# Patient Record
Sex: Male | Born: 1955
Health system: Southern US, Community
[De-identification: ages and names within clinical notes are randomized; demographics above are authoritative.]

## PROBLEM LIST (undated history)

## (undated) DIAGNOSIS — I4891 Unspecified atrial fibrillation: Secondary | ICD-10-CM

---

## 2020-01-02 MED FILL — CARTIA XT 240 MG CAPSULE: 240 | 30 days supply | Qty: 30 | Fill #0

## 2020-01-02 MED FILL — METOPROLOL SUCCINATE ER 25: 25 | 30 days supply | Qty: 30 | Fill #0

## 2020-01-02 MED FILL — PROPRANOLOL 10 MG TABLET: 10 | 7 days supply | Qty: 30 | Fill #0

## 2020-01-02 MED FILL — ELIQUIS 5 MG TABLET: 5 | 30 days supply | Qty: 60 | Fill #0

## 2020-07-15 DIAGNOSIS — J9601 Acute respiratory failure with hypoxia: Principal | ICD-10-CM

## 2020-07-15 DIAGNOSIS — R55 Syncope and collapse: Secondary | ICD-10-CM | POA: Diagnosis present

## 2020-07-15 DIAGNOSIS — S0101XA Laceration without foreign body of scalp, initial encounter: Secondary | ICD-10-CM

## 2020-07-15 DIAGNOSIS — U071 COVID-19: Secondary | ICD-10-CM

## 2020-07-15 HISTORY — DX: Unspecified atrial fibrillation: I48.91

## 2020-07-15 NOTE — H&P (Incomplete)
Family [facility] Admission History and Physical Service Pager: [phone]  Patient name: Patrick Horne Medical record number: [id number] Date of birth: 20-Feb-1956 Age: 65 y.o. Gender: male  Primary Care Provider: Pcp, No Consultants: *** Code Status: ***  Chief Complaint: ***  Assessment and Plan: Patrick Horne is a 65 y.o. male presenting with *** . PMH is significant for ***  Syncope  Afib 2 years ago.  eliquis Diltiazem Propanolol  COVID sxs started Thursday 5 pills/day for 3 days    FEN/GI: *** Prophylaxis: ***  Disposition: ***  History of Present Illness:  Patrick Horne is a 66 y.o. male presenting with ***  Shaving, feeling spacing. Woke up on the ground with his wife over him. No fecal or urinary incontinence.   Felt poorly last night and needed to sit down and put a cool towel over his head.  Wife heard a thud and went immediately  Second episode in the ED.  PA was putting staples in the scalp incision. When he went to lie down, he passed out again briefly.    Also felt the world spinning with sitting up.  Review Of Systems: Per HPI with the following additions: ***  ROS  Patient Active Problem List   Diagnosis Date Noted  . Syncope 07/15/2020    Past Medical History: Past Medical History:  Diagnosis Date  . Atrial fibrillation [facility])     Past Surgical History: History reviewed. No pertinent surgical history.  Social History: Social History   Tobacco Use  . Smoking status: Never Smoker  . Smokeless tobacco: Never Used   Additional social history: ***  Please also refer to relevant sections of EMR.  Family History: History reviewed. No pertinent family history. (***If not completed, MUST add something in)  Allergies and Medications: No Known Allergies No current facility-administered medications on file prior to encounter.   Current Outpatient Medications on File Prior to Encounter  Medication Sig  Dispense Refill  . diltiazem (CARDIZEM CD) 240 MG 24 hr capsule Take 240 mg by mouth daily.    Marland Kitchen ELIQUIS 5 MG TABS tablet Take 5 mg by mouth 2 (two) times daily.    . magnesium oxide (MAG-OX) 400 MG tablet Take 400 mg by mouth daily.    . metoprolol succinate (TOPROL-XL) 25 MG 24 hr tablet Take 25 mg by mouth daily.    . propranolol (INDERAL) 10 MG tablet Take 10 mg by mouth daily.    Marland Kitchen VITAMIN D, CHOLECALCIFEROL, PO Take 1 capsule by mouth daily.    Marland Kitchen zinc gluconate 50 MG tablet Take 50 mg by mouth daily.      Objective: BP (!) 101/57   Pulse 67   Temp 97.9 F (36.6 C) (Oral)   Resp 20   Ht 6\' 2"  (1.88 m)   Wt 104.3 kg   SpO2 92%   BMI 29.53 kg/m  Exam: General: *** Eyes: *** ENTM: *** Neck: *** Cardiovascular: *** Respiratory: *** Gastrointestinal: *** MSK: *** Derm: *** Neuro: *** Psych: ***  Labs and Imaging: CBC BMET  Recent Labs  Lab 07/15/20 1043  WBC 5.1  HGB 16.3  HCT 47.8  PLT 151   Recent Labs  Lab 07/15/20 1043  NA 135  K 4.0  CL 104  CO2 19*  BUN 22  CREATININE 1.26*  GLUCOSE 115*  CALCIUM 8.6*     ***  09/12/20, MD 07/15/2020, 3:11 PM PGY-***, [facility] Family Medicine FPTS Intern pager: [phone], text  pages welcome

## 2020-07-16 DIAGNOSIS — S0101XA Laceration without foreign body of scalp, initial encounter: Secondary | ICD-10-CM

## 2020-07-16 DIAGNOSIS — R55 Syncope and collapse: Secondary | ICD-10-CM

## 2020-07-17 DIAGNOSIS — R55 Syncope and collapse: Secondary | ICD-10-CM

## 2020-07-18 DIAGNOSIS — U071 COVID-19: Secondary | ICD-10-CM

## 2022-07-01 IMAGING — CT CT CERVICAL SPINE W/O CM
3 of 4 series · 13 of 33 positions shown, 16 images · non-contrast
Comparison: None

CLINICAL DATA: 64-year-old male with altered mental status
following head trauma. Also with history of a blood thinners.

EXAM:
CT HEAD WITHOUT CONTRAST
CT CERVICAL SPINE WITHOUT CONTRAST
TECHNIQUE: Multidetector CT imaging of the head and cervical spine was
performed following the standard protocol without intravenous
contrast. Multiplanar CT image reconstructions of the cervical spine
were also generated.

[Series 7: sag bone · sagittal · 0.37mm/px · 5 of 98 slices shown, 6 images]
[im 33/98  bone]
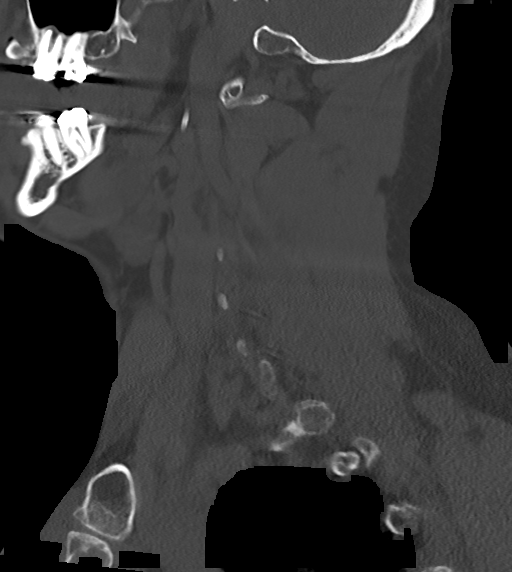
[im 41/98  bone]
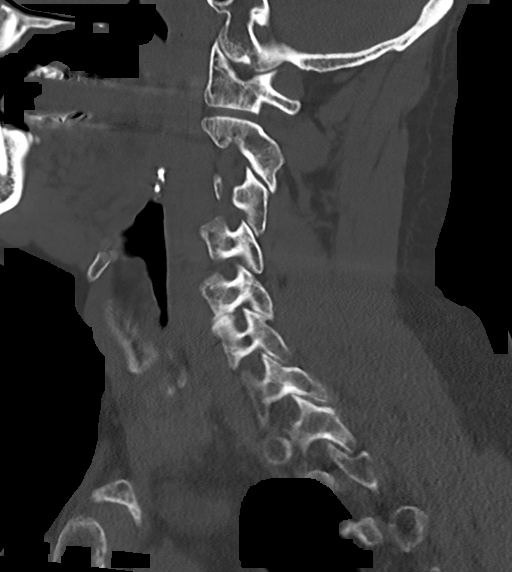
[im 49/98  soft-tissue]
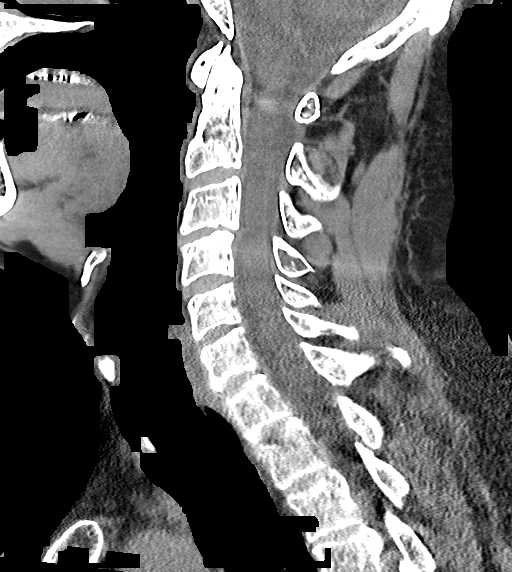
[im 49/98  bone]
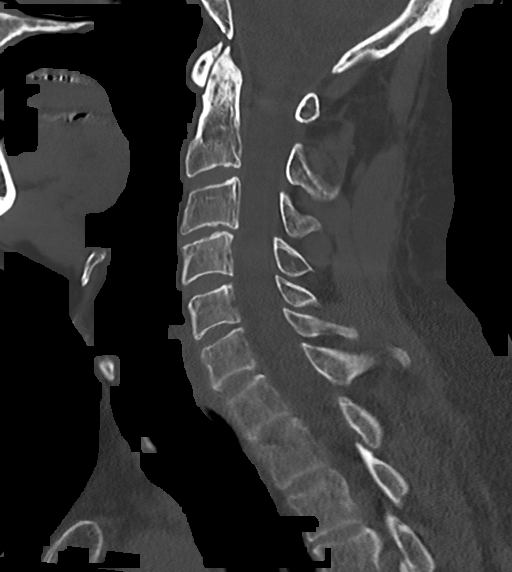
[im 57/98  bone]
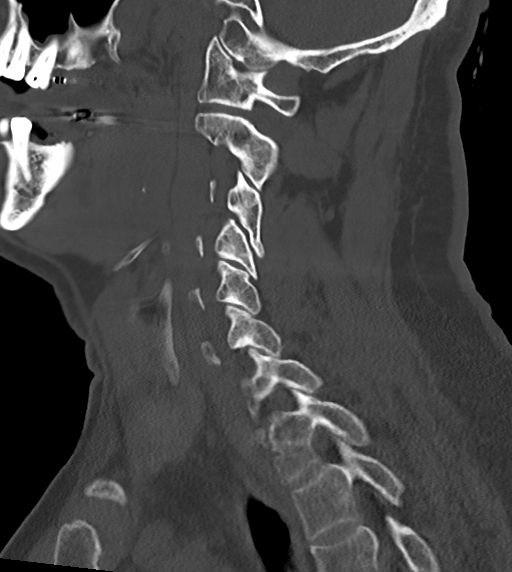
[im 65/98  bone]
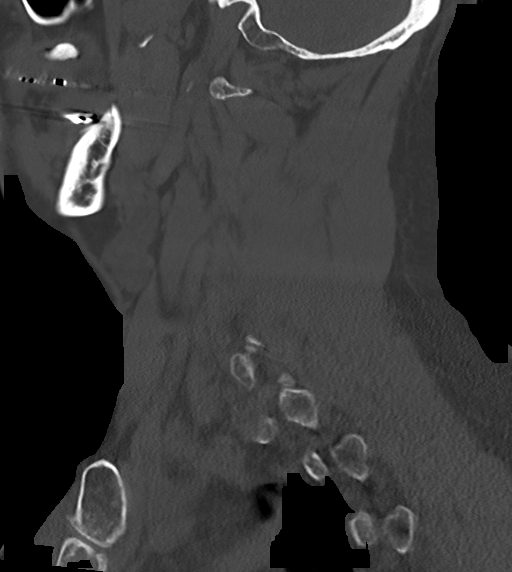

[Series 8: cor bone · coronal · 0.42mm/px · 3 of 142 slices shown]
[im 45/142  bone]
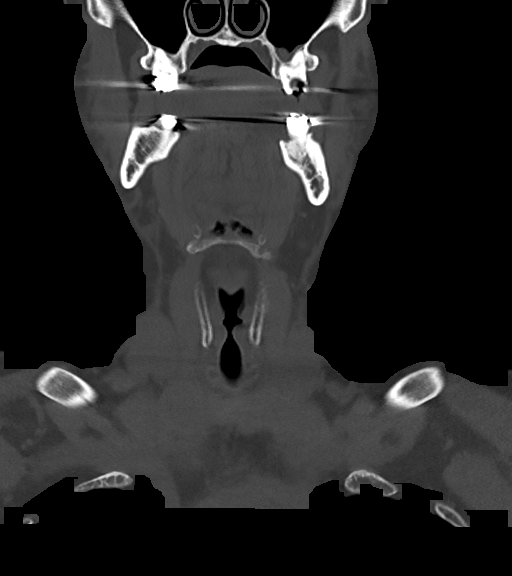
[im 62/142  bone]
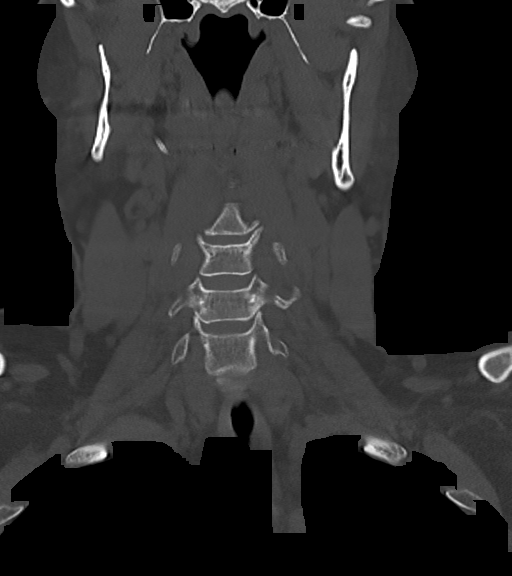
[im 79/142  bone]
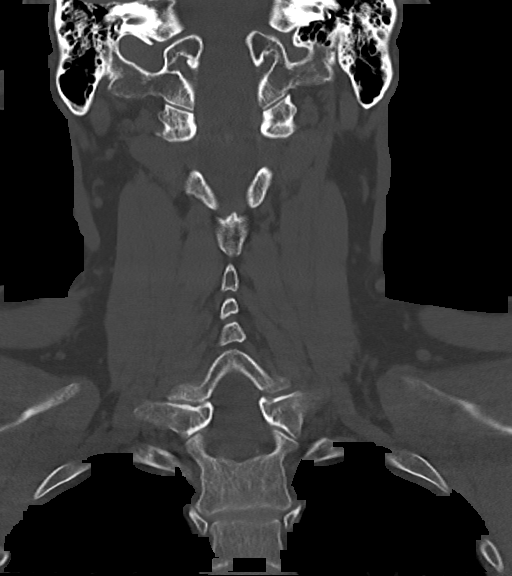

[Series 10: orthogonal axials · axial · 0.21mm/px · z∈[-345,-204]mm · 5 of 112 slices shown, 7 images]
[im 19/112  soft-tissue]
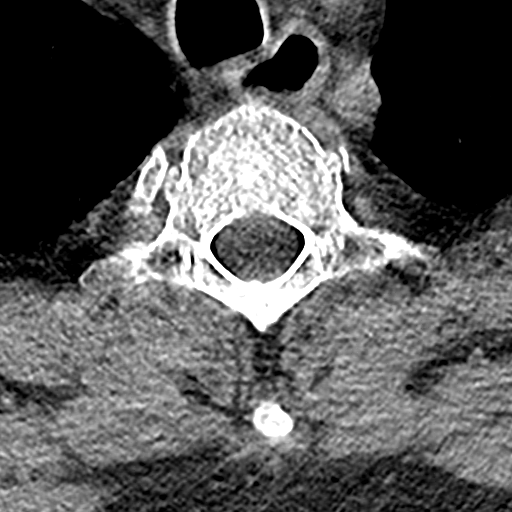
[im 19/112  bone]
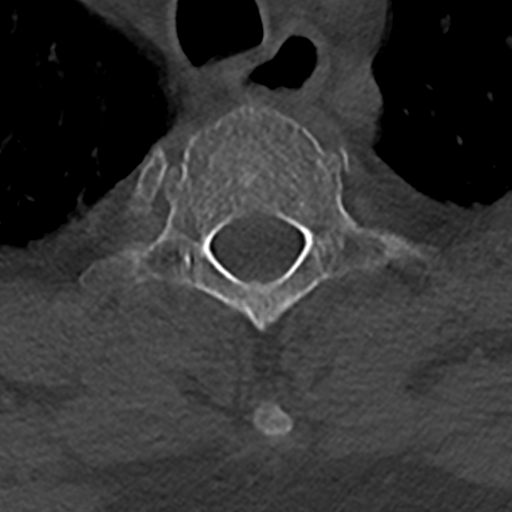
[im 38/112  bone]
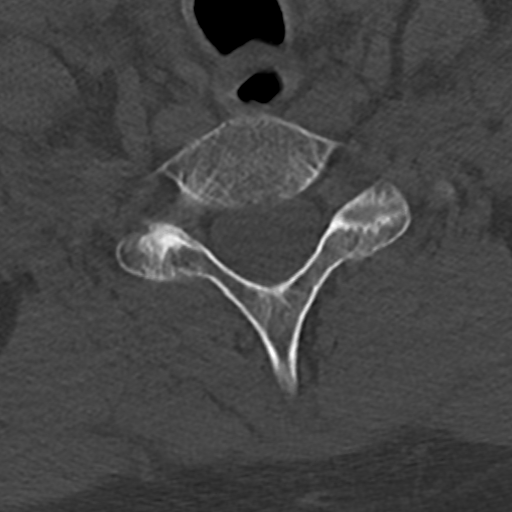
[im 56/112  bone]
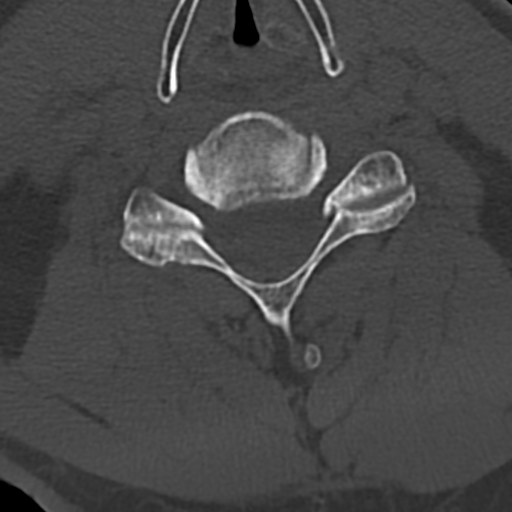
[im 75/112  bone]
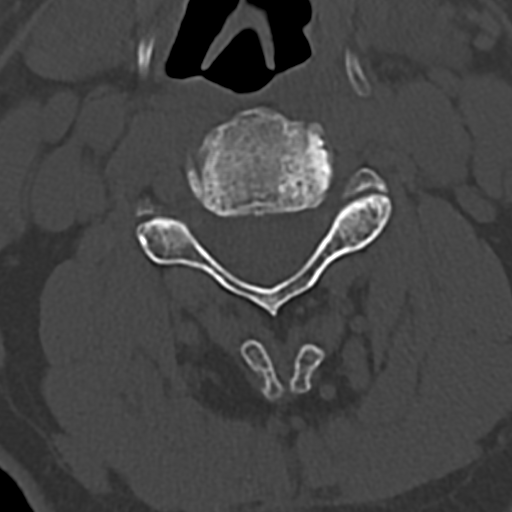
[im 93/112  soft-tissue]
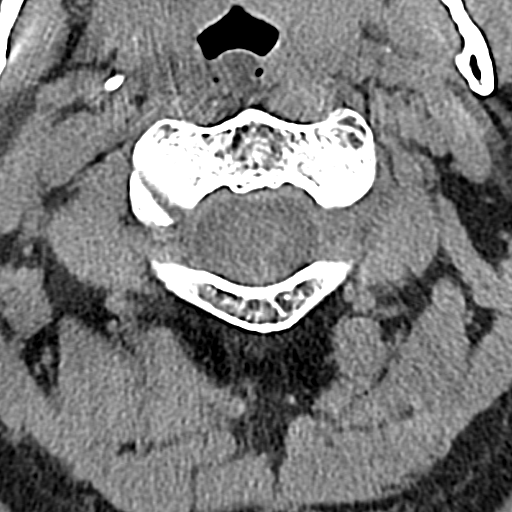
[im 93/112  bone]
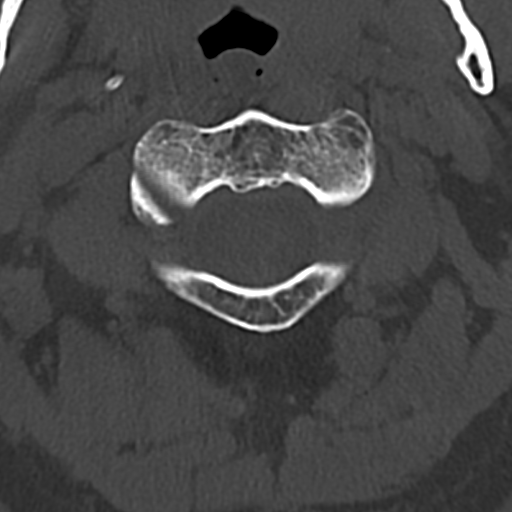

[13 of 33 positions shown; findings below may reference images not displayed]

FINDINGS: CT HEAD FINDINGS

Brain: No evidence of acute infarction, hemorrhage, hydrocephalus,
extra-axial collection or mass lesion/mass effect.

Vascular: No hyperdense vessel or unexpected calcification.

Skull: Normal. Negative for fracture or focal lesion.

Sinuses/Orbits: Visualized paranasal sinuses and orbits are
unremarkable.

Other: None.

CT CERVICAL SPINE FINDINGS

Alignment: Normal.

Skull base and vertebrae: No acute fracture in the cervical spine.
No primary bone lesion or focal pathologic process. Loss of height
at the T1 and potentially T2 levels is age indeterminate, not well
assessed due to extensive artifact due to beam attenuation about the
shoulders.

Soft tissues and spinal canal: No prevertebral fluid or swelling. No
visible canal hematoma.

Disc levels: Degenerative changes greatest at C3-4 with minimal
retrolisthesis of C3 on C4 with disc space narrowing and
uncovertebral spurring more pronounced on the LEFT than the RIGHT

Upper chest: Negative

Other: None
IMPRESSION: 1. No acute intracranial abnormality.
2. No evidence of acute fracture or subluxation of the cervical
spine, see below with regards to the thoracic spine.
3. Compression fractures at the T1 and potentially T2 levels are age
indeterminate, not well assessed due to extensive artifact due to
beam attenuation about the shoulders. These are essentially age
indeterminate compression fractures in the upper thoracic spine, no
gross edema in the area but with limited assessment due to beam
attenuation in this location. Dedicated imaging of this area could
be considered as clinically warranted.
4. Degenerative changes in the cervical spine greatest at C3-4 as
described.
